# Patient Record
Sex: Male | Born: 1995 | Race: Black or African American | Hispanic: No | Marital: Single | Smoking: Current every day smoker
Health system: Southern US, Community
[De-identification: ages and names within clinical notes are randomized; demographics above are authoritative.]

## PROBLEM LIST (undated history)

## (undated) HISTORY — PX: FEMUR SURGERY: SHX943

---

## 2015-04-24 ENCOUNTER — Emergency Department (HOSPITAL_COMMUNITY): Payer: Self-pay

## 2015-04-24 ENCOUNTER — Emergency Department (HOSPITAL_COMMUNITY)
Admission: EM | Admit: 2015-04-24 | Discharge: 2015-04-24 | Disposition: A | Discharge status: Home / Self Care | Payer: Self-pay | Attending: Emergency Medicine | Admitting: Emergency Medicine

## 2015-04-24 ENCOUNTER — Encounter (HOSPITAL_COMMUNITY): Payer: Self-pay | Admitting: Emergency Medicine

## 2015-04-24 DIAGNOSIS — K604 Rectal fistula: Secondary | ICD-10-CM | POA: Insufficient documentation

## 2015-04-24 DIAGNOSIS — Z720 Tobacco use: Secondary | ICD-10-CM | POA: Insufficient documentation

## 2015-04-24 LAB — CBC
HCT: 42.9 % (ref 39.0–52.0)
Hemoglobin: 14.5 g/dL (ref 13.0–17.0)
MCH: 30.6 pg (ref 26.0–34.0)
MCHC: 33.8 g/dL (ref 30.0–36.0)
MCV: 90.5 fL (ref 78.0–100.0)
PLATELETS: 189 10*3/uL (ref 150–400)
RBC: 4.74 MIL/uL (ref 4.22–5.81)
RDW: 11.3 % — ABNORMAL LOW (ref 11.5–15.5)
WBC: 5.8 10*3/uL (ref 4.0–10.5)

## 2015-04-24 LAB — COMPREHENSIVE METABOLIC PANEL
ALBUMIN: 4.5 g/dL (ref 3.5–5.0)
ALT: 18 U/L (ref 17–63)
ANION GAP: 8 (ref 5–15)
AST: 22 U/L (ref 15–41)
Alkaline Phosphatase: 88 U/L (ref 38–126)
BUN: 7 mg/dL (ref 6–20)
CALCIUM: 9.5 mg/dL (ref 8.9–10.3)
CO2: 25 mmol/L (ref 22–32)
Chloride: 110 mmol/L (ref 101–111)
Creatinine, Ser: 0.98 mg/dL (ref 0.61–1.24)
GFR calc non Af Amer: >60 mL/min (ref >60)
GLUCOSE: 96 mg/dL (ref 65–99)
POTASSIUM: 3.8 mmol/L (ref 3.5–5.1)
SODIUM: 143 mmol/L (ref 135–145)
TOTAL PROTEIN: 7.3 g/dL (ref 6.5–8.1)
Total Bilirubin: 0.6 mg/dL (ref 0.3–1.2)

## 2015-04-24 LAB — RAPID HIV SCREEN (HIV 1/2 AB+AG)
HIV 1/2 ANTIBODIES: NONREACTIVE
HIV-1 P24 ANTIGEN - HIV24: NONREACTIVE

## 2015-04-24 LAB — LIPASE, BLOOD: Lipase: 19 U/L — ABNORMAL LOW (ref 22–51)

## 2015-04-24 MED ORDER — IOHEXOL 300 MG/ML  SOLN
100.0000 mL | Freq: Once | INTRAMUSCULAR | Status: AC | PRN
  Administered 2015-04-24: 100 mL via INTRAVENOUS

## 2015-04-24 MED ORDER — ONDANSETRON HCL 4 MG/2ML IJ SOLN
4.0000 mg | Freq: Once | INTRAMUSCULAR | Status: AC
  Administered 2015-04-24: 4 mg via INTRAVENOUS
  Filled 2015-04-24: qty 2

## 2015-04-24 MED ORDER — BELLADONNA ALKALOIDS-OPIUM 16.2-60 MG RE SUPP: 1.0000 | Freq: Three times a day (TID) | RECTAL | Status: AC | PRN

## 2015-04-24 MED ORDER — SENNOSIDES-DOCUSATE SODIUM 8.6-50 MG PO TABS: 2.0000 | ORAL_TABLET | Freq: Every day | ORAL | Status: AC

## 2015-04-24 MED ORDER — SODIUM CHLORIDE 0.9 % IV SOLN
1000.0000 mL | Freq: Once | INTRAVENOUS | Status: AC
  Administered 2015-04-24: 1000 mL via INTRAVENOUS

## 2015-04-24 MED ORDER — BELLADONNA ALKALOIDS-OPIUM 16.2-60 MG RE SUPP
1.0000 | Freq: Once | RECTAL | Status: AC
  Administered 2015-04-24: 1 via RECTAL

## 2015-04-24 NOTE — ED Notes (Signed)
Patient C/O abdominal pain for almost 2 months. States that his abdomen hurts all over.  States that he has been having 3 to 4 bowel movements a day. States that his stools have been liquid and foul smelling. C/O passing a blood clot one time. States that recently the frequency of his stools have increased from 3 to 4 times a day to 5 to six times a day. Denies further blood in stool, C/O nausea and vomiting when he eats.

## 2015-04-24 NOTE — ED Notes (Signed)
Pt c/o generalized abdominal pain ongoing x 2 weeks. Pt denies N/V.

## 2015-04-24 NOTE — Discharge Instructions (Signed)
As discussed, it is very important that you follow up with our surgery colleagues for further evaluation and management.  Return here for concerning changes in your condition.   Anal Fistula An anal fistula is an abnormal tunnel that develops between the bowel and the skin near the outside of the anus, where stool (feces) comes out. The anus has many tiny glands that make lubricating fluid. Sometimes, these glands become plugged and infected, and that can cause a fluid-filled pocket (abscess) to form. An anal fistula often develops after this infection or abscess. CAUSES In most cases, an anal fistula is caused by a past or current anal abscess. Other causes include:  A complication of surgery.  Trauma to the rectal area.  Radiation to the area.  Medical conditions or diseases, such as:  Chronic inflammatory bowel disease, such as Crohn disease or ulcerative colitis.  Colon cancer or rectal cancer.  Diverticular disease, such as diverticulitis.  An STD (sexually transmitted disease), such as gonorrhea, chlamydia, or syphilis.  An infection that is caused by HIV (human immunodeficiency virus).  Foreign body in the rectum. SYMPTOMS Symptoms of this condition include:  Throbbing or constant pain that may be worse while you are sitting.  Swelling or irritation around the anus.  Drainage of pus or blood from an opening near the anus.  Pain with bowel movements.  Fever or chills. DIAGNOSIS Your health care provider will examine the area to find the openings of the anal fistula and the fistula tract. The external opening of the anal fistula may be seen during a physical exam. You may also have tests, including:  An exam of the rectal area with a gloved hand (digital rectal exam).  An exam with a probe or scope to help locate the internal opening of the fistula.  Imaging tests to find the exact location and path of the fistula. These tests may include X-rays, an ultrasound, a  CT scan, or MRI. The path is made visible by a dye that is injected into the fistula opening. You may have other tests to find the cause of the anal fistula. TREATMENT The most common treatment for an anal fistula is surgery. The type of surgery that is used will depend on where the fistula is located and how complex the fistula is. Surgical options include:  A fistulotomy. The whole fistula is opened up, and the contents are drained to promote healing.  Seton placement. A silk string (seton) is placed into the fistula during a fistulotomy. This helps to drain any infection to promote healing.  Advancement flap procedure. Tissue is removed from your rectum or the skin around the anus and is attached to the opening of the fistula.  Bioprosthetic plug. A cone-shaped plug is made from your tissue and is used to block the opening of the fistula. Some anal fistulas do not require surgery. A nonsurgical treatment option involves injecting a fibrin glue to seal the fistula. You also may be prescribed an antibiotic medicine to treat an infection. HOME CARE INSTRUCTIONS Medicines  Take over-the-counter and prescription medicines only as told by your health care provider.  If you were prescribed an antibiotic medicine, take it as told by your health care provider. Do not stop taking the antibiotic even if you start to feel better.  Use a stool softener or a laxative if told to do so by your health care provider. General Instructions  Eat a high-fiber diet as told by your health care provider. This can help  to prevent constipation.  Drink enough fluid to keep your urine clear or pale yellow.  Take a warm sitz bath for 15-20 minutes, 3-4 times per day, or as told by your health care provider. Sitz baths can ease your pain and discomfort and help with healing.  Follow good hygiene to keep the anal area as clean and dry as possible. Use wet toilet paper or a moist towelette after each bowel  movement.  Keep all follow-up visits as told by your health care provider. This is important. SEEK MEDICAL CARE IF:  You have increased pain that is not controlled with medicines.  You have new redness or swelling around the anal area.  You have new fluid, blood, or pus coming from the anal area.  You have tenderness or warmth around the anal area. SEEK IMMEDIATE MEDICAL CARE IF:  You have a fever.  You have severe pain.  You have chills or diarrhea.  You have severe problems urinating or having a bowel movement.   This information is not intended to replace advice given to you by your health care provider. Make sure you discuss any questions you have with your health care provider.   Document Released: 06/09/2008 Document Revised: 03/18/2015 Document Reviewed: 09/22/2014 Elsevier Interactive Patient Education [employer]2016 [employer] Inc.

## 2015-04-24 NOTE — ED Notes (Signed)
Patient refused GC/Chlamidia collection

## 2015-04-24 NOTE — ED Provider Notes (Signed)
CSN: [id number]     Arrival date & time 04/24/15  0846 History   First MD Initiated Contact with Patient 04/24/15 [phone]     Chief Complaint  Patient presents with  . Abdominal Pain    HPI  Patient presents with concern of persistent, worsening abdominal pain, low back pain, rectal pain. Symptoms began about 7 weeks ago, since onset patient has developed persistent pain in the aforementioned areas, as well as frequency of bowel movements. Pain is sharp, severe, with a burning sensation about the rectum. There is intermittent dysuria, but nothing sustained, no inability to urinate, no penis pain, no scrotal pain, no swelling. No fever, chills. No relief with anything. Patient is homosexual, active, last anal intercourse about 3 weeks ago. This was painful. Patient was well prior to the onset of symptoms.   History reviewed. No pertinent past medical history. Past Surgical History  Procedure Laterality Date  . Femur surgery Right    No family history on file. Social History  Substance Use Topics  . Smoking status: Current Every Day Smoker  . Smokeless tobacco: None  . Alcohol Use: No    Review of Systems  Constitutional:       Per HPI, otherwise negative  HENT:       Per HPI, otherwise negative  Respiratory:       Per HPI, otherwise negative  Cardiovascular:       Per HPI, otherwise negative  Gastrointestinal: Negative for vomiting.  Endocrine:       Negative aside from HPI  Genitourinary:       Neg aside from HPI   Musculoskeletal:       Per HPI, otherwise negative  Skin: Negative.   Neurological: Negative for syncope.      Allergies  Review of patient's allergies indicates no known allergies.  Home Medications   Prior to Admission medications   Not on File   BP 129/80 mmHg  Pulse 74  Temp(Src) 97.4 F (36.3 C) (Oral)  Resp 18  Ht  (1.93 m)  Wt 165 lb (74.844 kg)  BMI 20.09 kg/m2  SpO2 98% Physical Exam  Constitutional: He is oriented to  person, place, and time. He appears well-developed. No distress.  HENT:  Head: Normocephalic and atraumatic.  Eyes: Conjunctivae and EOM are normal.  Cardiovascular: Normal rate and regular rhythm.   Pulmonary/Chest: Effort normal. No stridor. No respiratory distress.  Abdominal: He exhibits no distension. Hernia confirmed negative in the right inguinal area and confirmed negative in the left inguinal area.    Genitourinary: Penis normal. Right testis shows no mass, no swelling and no tenderness. Left testis shows no mass, no swelling and no tenderness.     Musculoskeletal: He exhibits no edema.  Lymphadenopathy:       Right: No inguinal adenopathy present.       Left: No inguinal adenopathy present.  Neurological: He is alert and oriented to person, place, and time.  Skin: Skin is warm and dry.  Psychiatric: He has a normal mood and affect.  Nursing note and vitals reviewed.   ED Course  Procedures (including critical care time) Labs Review Labs Reviewed  LIPASE, BLOOD - Abnormal; Notable for the following:    Lipase 19 (*)    All other components within normal limits  CBC - Abnormal; Notable for the following:    RDW 11.3 (*)    All other components within normal limits  COMPREHENSIVE METABOLIC PANEL  RAPID HIV SCREEN (HIV 1/2  AB+AG)  URINALYSIS, ROUTINE W REFLEX MICROSCOPIC (NOT AT [facility])  RPR  GC/CHLAMYDIA PROBE AMP ([place]) NOT AT [facility]    Imaging Review Ct Abdomen Pelvis W Contrast  04/24/2015  CLINICAL DATA:  Rectal and lower abdominal pain EXAM: CT ABDOMEN AND PELVIS WITH CONTRAST TECHNIQUE: Multidetector CT imaging of the abdomen and pelvis was performed using the standard protocol following bolus administration of intravenous contrast. CONTRAST:  OMNIPAQUE IOHEXOL 300 MG/ML  SOLN COMPARISON:  None. FINDINGS: Lower chest:  Lung bases are clear. Hepatobiliary: Liver is prominent, measuring 18.2 cm in length. No focal liver lesions are identified.  Gallbladder wall is not thickened. There is no biliary duct dilatation. Note that there is mild fatty change near the fissure for the ligamentum teres. Pancreas: No mass or inflammatory focus. Spleen: No focal splenic lesions are identified. Adrenals/Urinary Tract: Adrenals appear normal bilaterally. Kidneys bilaterally show no mass or hydronephrosis on either side. There is no renal or ureteral calculus on either side. Urinary bladder is midline with wall thickness within normal limits. Stomach/Bowel: There is rectal wall thickening with stranding in the mesenteric a surrounding the rectal wall. No contrast extravasation. No well-defined abscess. There are enlarged perirectal lymph nodes, largest measuring 1.0 x 1.0 cm. Elsewhere, there is no bowel wall thickening. No fistula or mesenteric thickening is noted elsewhere in the abdomen or pelvis. No bowel obstruction. No free air or portal venous air. Vascular/Lymphatic: Perirectal lymph node prominence is noted in the above section. Largest individual perirectal lymph node measures 1 x 1 cm. No other adenopathy is appreciable in the abdomen or pelvis. There is no abdominal aortic aneurysm. 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 Reproductive: Prostate is normal in size and contour. No pelvic mass appreciable. Other: Appendix appears normal. No ascites or abscess in the abdomen or pelvis. Musculoskeletal: No bowel wall or intramuscular lesions are appreciable. There are no blastic or lytic bone lesions. There is evidence of old trauma involving the proximal right femur with screw and rod fixation, incompletely visualized. There is lower lumbar levoscoliosis. IMPRESSION: There is rectal wall thickening with perirectal stranding. 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 No contrast extravasation is appreciable. There are prominent perirectal lymph nodes. No frank abscess seen. Question infectious etiology  versus inflammatory bowel disease to account for these findings. Elsewhere, small and large bowel appear unremarkable. No bowel obstruction. No mesenteric inflammation. No abscess. Appendix appears normal. No renal or ureteral calculus.  No hydronephrosis. Liver prominent without focal lesion. Mild fatty infiltration near the fissure for the ligamentum teres. Electronically Signed   By: Bretta Bang III M.D.   On: 04/24/2015 12:14   I have personally reviewed and evaluated these images and lab results as part of my medical decision-making.  12:40 PM On repeat exam the patient is in no distress. We discussed all findings at length, including outstanding labs, which the patient will be made aware of abnormal results. I emphasized the importance of following up with surgery as an outpatient, taking all medication as directed, and return precautions.  MDM  Young male, sexually active with anal intercourse presents with new rectal pain, and on exam has a seizure. Given his description of increasing pain, there suspicion for the space infection versus proctitis   versus inflammatory bowel disease.  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 Patient had some relief with suppository medication. Patient  started on a course of this, stool softener, discharged in stable condition to follow-up with outpatient surgery.    Gerhard Munchobert Souleymane Saiki, MD 04/24/15 1242

## 2015-04-24 NOTE — ED Notes (Signed)
Pt provided a urine sample however stated that he needed to go because he has his brother's vehicle and his brother needs the vehicle. IV removed and urine was not sent to lab. Pt leaving AMA.

## 2015-04-25 LAB — RPR, QUANT+TP ABS (REFLEX): T Pallidum Abs: UNDETERMINED

## 2015-04-25 LAB — RPR: RPR: REACTIVE — AB

## 2015-04-26 ENCOUNTER — Telehealth (HOSPITAL_BASED_OUTPATIENT_CLINIC_OR_DEPARTMENT_OTHER): Payer: Self-pay | Admitting: Emergency Medicine

## 2015-04-26 NOTE — Telephone Encounter (Signed)
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

## 2015-04-27 ENCOUNTER — Telehealth (HOSPITAL_BASED_OUTPATIENT_CLINIC_OR_DEPARTMENT_OTHER): Payer: Self-pay | Admitting: Emergency Medicine

## 2015-04-28 ENCOUNTER — Telehealth (HOSPITAL_BASED_OUTPATIENT_CLINIC_OR_DEPARTMENT_OTHER): Payer: Self-pay | Admitting: Emergency Medicine

## 2015-04-29 ENCOUNTER — Telehealth (HOSPITAL_BASED_OUTPATIENT_CLINIC_OR_DEPARTMENT_OTHER): Payer: Self-pay | Admitting: Emergency Medicine

## 2015-04-30 ENCOUNTER — Telehealth (HOSPITAL_BASED_OUTPATIENT_CLINIC_OR_DEPARTMENT_OTHER): Payer: Self-pay | Admitting: Emergency Medicine

## 2015-04-30 NOTE — Telephone Encounter (Signed)
Post ED Visit - Positive Culture Follow-up: Successful Patient Follow-Up  Culture assessed and recommendations reviewed by: []  Celedonio MiyamotoJeremy Frens, Pharm.D., BCPS-AQ ID []  Georgina PillionElizabeth Martin, Pharm.D., BCPS []  [place]h Pham, [place]D., BCPS, AAHIVP []  Estella HuskMichelle Turner, Pharm.D., BCPS, AAHIVP []  [place]isty Reyes, [address]Pharm[address]D. []  Cassie Roseanne RenoStewart, [place]Pharm.D.  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

## 2016-04-21 IMAGING — CT CT ABD-PELV W/ CM
2 of 4 series · 15 of 46 positions shown, 17 images · IV contrast (Omni 300)
Comparison: None.

CLINICAL DATA: Rectal and lower abdominal pain

EXAM:
CT ABDOMEN AND PELVIS WITH CONTRAST
TECHNIQUE: Multidetector CT imaging of the abdomen and pelvis was performed
using the standard protocol following bolus administration of
intravenous contrast.
CONTRAST:  100mL OMNIPAQUE IOHEXOL 300 MG/ML  SOLN

[Series 2: a/p w/ 5mm · axial · 0.64mm/px · z∈[+577,+1012]mm · 12 of 95 slices shown, 14 images]
[im 4/95  soft-tissue]
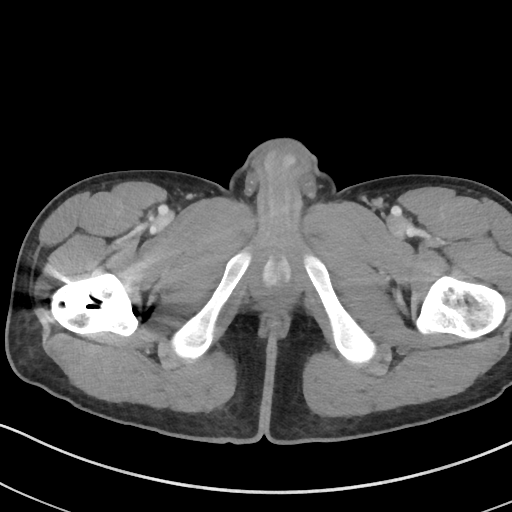
[im 4/95  bone]
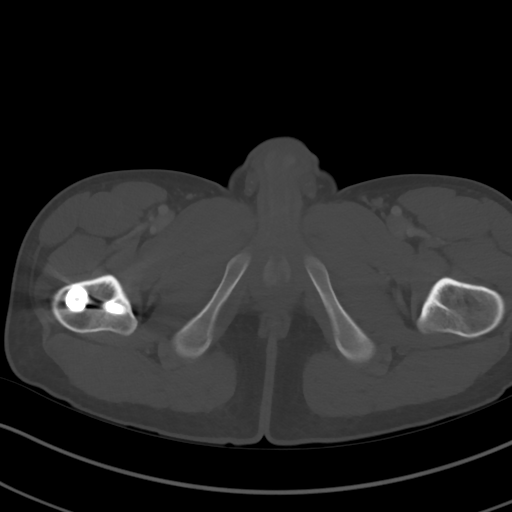
[im 12/95  soft-tissue]
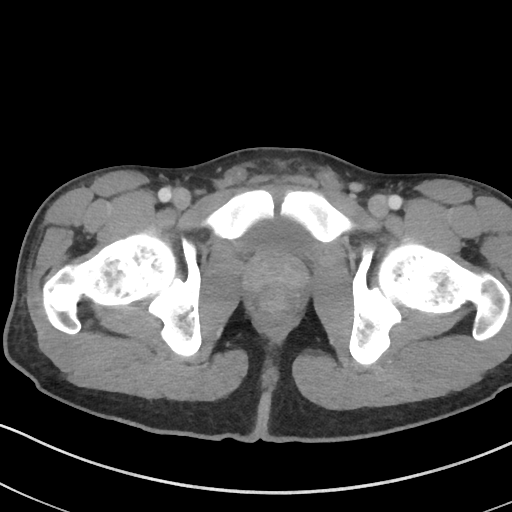
[im 20/95  soft-tissue]
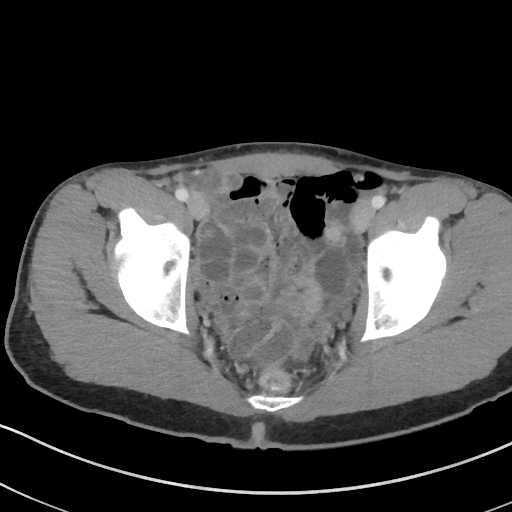
[im 28/95  soft-tissue]
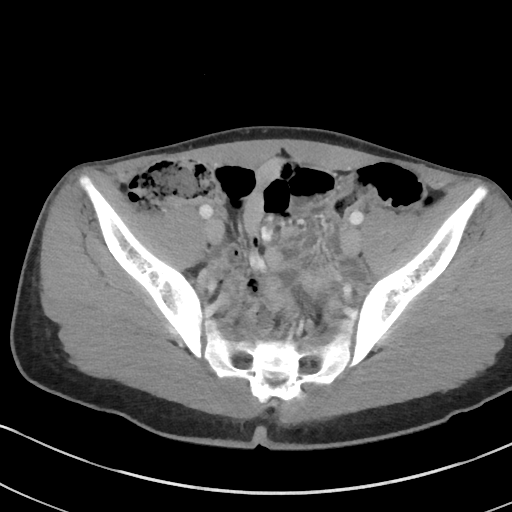
[im 36/95  soft-tissue]
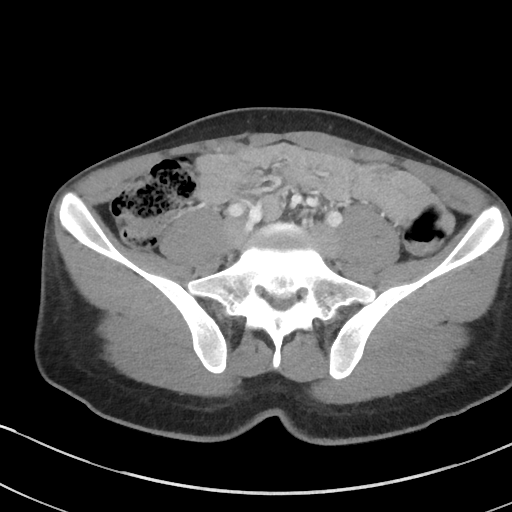
[im 44/95  soft-tissue]
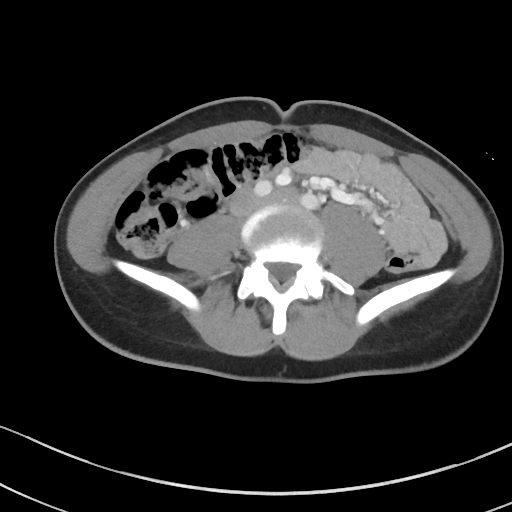
[im 51/95  soft-tissue]
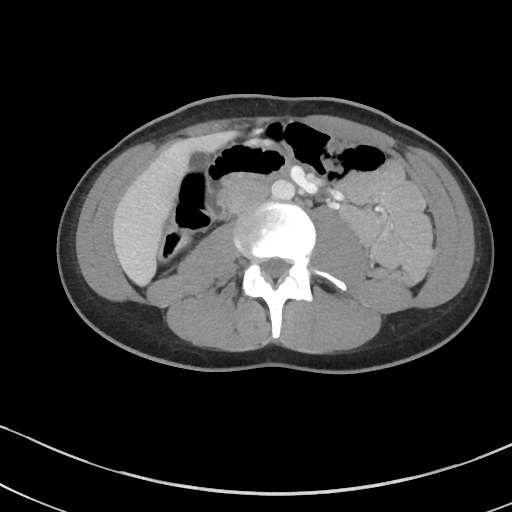
[im 59/95  soft-tissue]
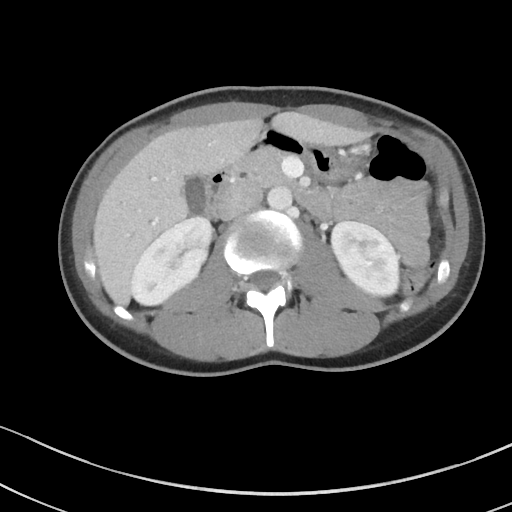
[im 67/95  soft-tissue]
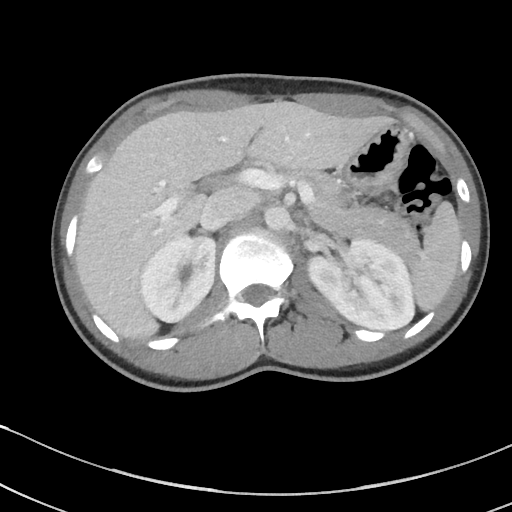
[im 67/95  bone]
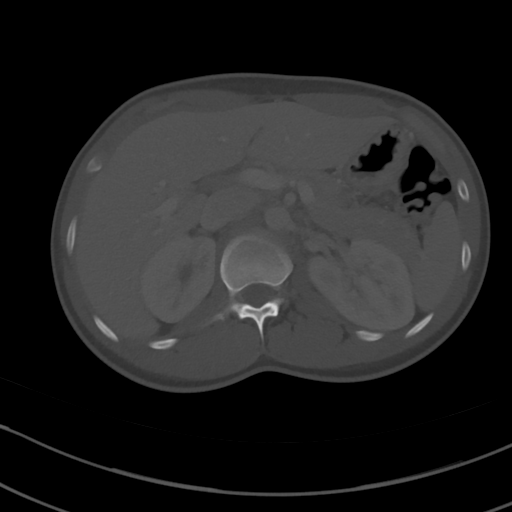
[im 75/95  soft-tissue]
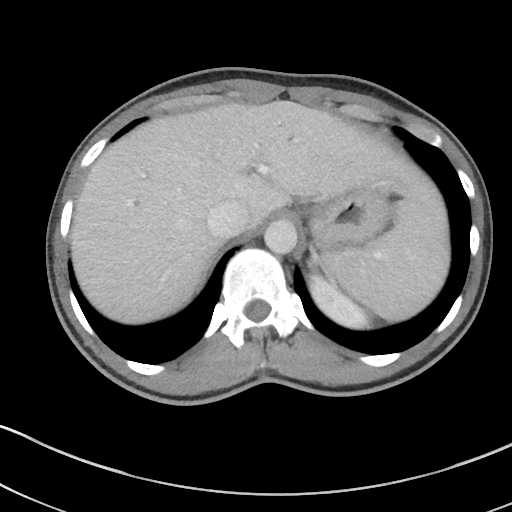
[im 83/95  soft-tissue]
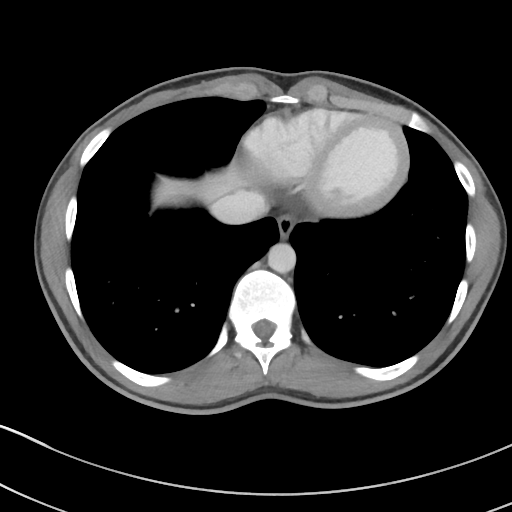
[im 91/95  soft-tissue]
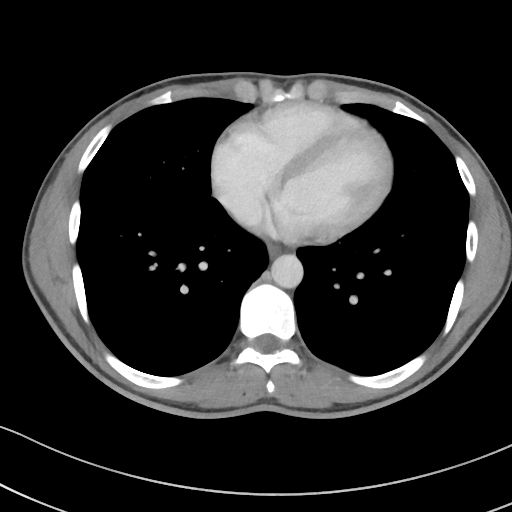

[Series 5: a/p w/ cor · coronal · 0.65mm/px · 3 of 118 slices shown]
[im 40/118  soft-tissue]
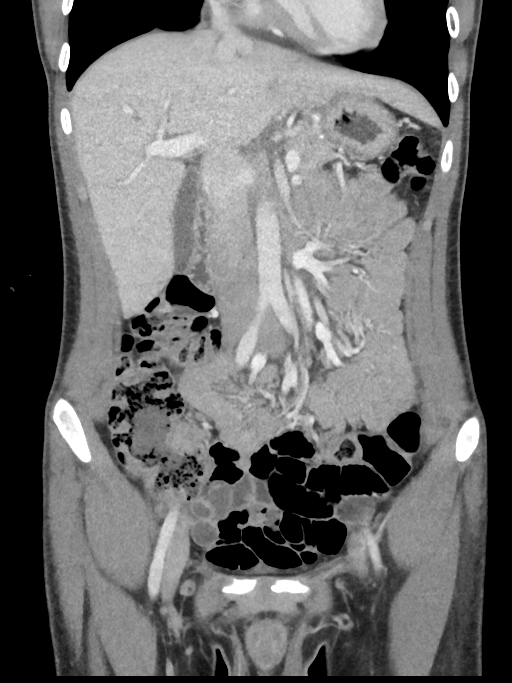
[im 53/118  soft-tissue]
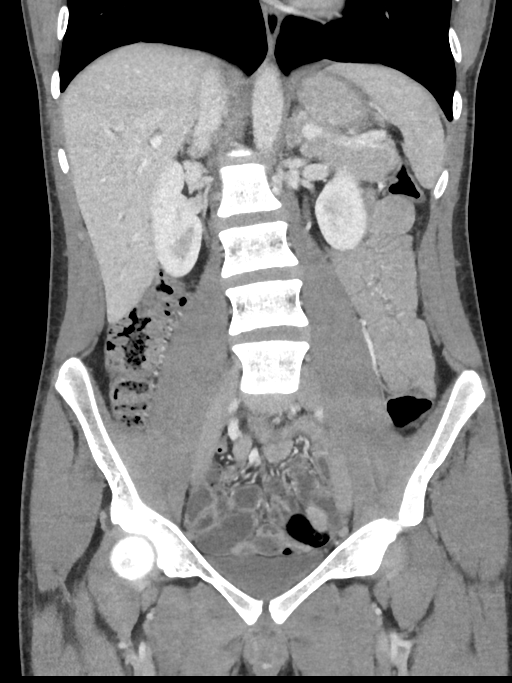
[im 66/118  soft-tissue]
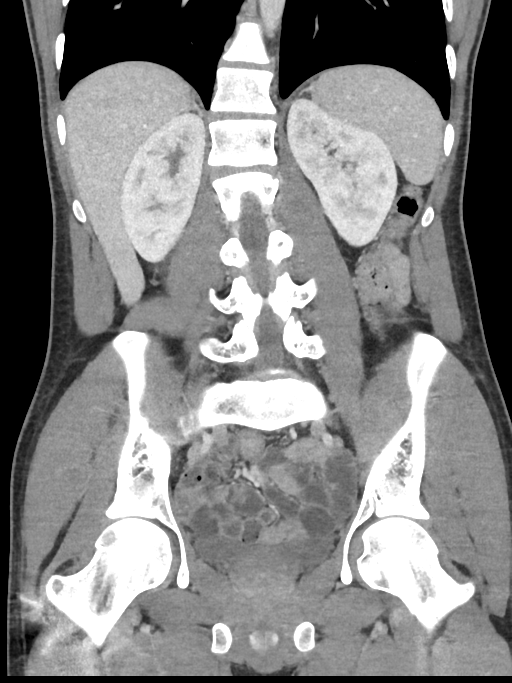

[15 of 46 positions shown; findings below may reference images not displayed]

FINDINGS: Lower chest:  Lung bases are clear.

Hepatobiliary: Liver is prominent, measuring 18.2 cm in length. No
focal liver lesions are identified. Gallbladder wall is not
thickened. There is no biliary duct dilatation. Note that there is
mild fatty change near the fissure for the ligamentum teres.

Pancreas: No mass or inflammatory focus.

Spleen: No focal splenic lesions are identified.

Adrenals/Urinary Tract: Adrenals appear normal bilaterally. Kidneys
bilaterally show no mass or hydronephrosis on either side. There is
no renal or ureteral calculus on either side. Urinary bladder is
midline with wall thickness within normal limits.

Stomach/Bowel: There is rectal wall thickening with stranding in the
mesenteric a surrounding the rectal wall. No contrast extravasation.
No well-defined abscess. There are enlarged perirectal lymph nodes,
largest measuring 1.0 x 1.0 cm. Elsewhere, there is no bowel wall
thickening. No fistula or mesenteric thickening is noted elsewhere
in the abdomen or pelvis. No bowel obstruction. No free air or
portal venous air.

Vascular/Lymphatic: Perirectal lymph node prominence is noted in the
above section. Largest individual perirectal lymph node measures 1 x
1 cm. No other adenopathy is appreciable in the abdomen or pelvis.
There is no abdominal aortic aneurysm. There is a circumaortic left
renal vein, an anatomic variant.

Reproductive: Prostate is normal in size and contour. No pelvic mass
appreciable.

Other: Appendix appears normal. No ascites or abscess in the abdomen
or pelvis.

Musculoskeletal: No bowel wall or intramuscular lesions are
appreciable. There are no blastic or lytic bone lesions. There is
evidence of old trauma involving the proximal right femur with screw
and rod fixation, incompletely visualized. There is lower lumbar
levoscoliosis.
IMPRESSION: There is rectal wall thickening with perirectal stranding. There
appear to be small perirectal fistulae ending blindly in the
surrounding fat. No contrast extravasation is appreciable. There are
prominent perirectal lymph nodes. No frank abscess seen. Question
infectious etiology versus inflammatory bowel disease to account for
these findings.

Elsewhere, small and large bowel appear unremarkable. No bowel
obstruction. No mesenteric inflammation. No abscess. Appendix
appears normal.

No renal or ureteral calculus.  No hydronephrosis.

Liver prominent without focal lesion. Mild fatty infiltration near
the fissure for the ligamentum teres.
